# Patient Record
Sex: Male | Born: 1955 | Race: White | Hispanic: No | Marital: Married | Smoking: Never smoker
Health system: Southern US, Community
[De-identification: ages and names within clinical notes are randomized; demographics above are authoritative.]

## PROBLEM LIST (undated history)

## (undated) HISTORY — PX: NASAL SINUS SURGERY: SHX719

## (undated) HISTORY — PX: TENDON REPAIR: SHX5111

## (undated) HISTORY — PX: SHOULDER SURGERY: SHX246

---

## 2013-02-27 ENCOUNTER — Encounter (HOSPITAL_BASED_OUTPATIENT_CLINIC_OR_DEPARTMENT_OTHER): Payer: Self-pay | Admitting: Emergency Medicine

## 2013-02-27 ENCOUNTER — Emergency Department (HOSPITAL_BASED_OUTPATIENT_CLINIC_OR_DEPARTMENT_OTHER): Payer: BC Managed Care – PPO

## 2013-02-27 ENCOUNTER — Emergency Department (HOSPITAL_BASED_OUTPATIENT_CLINIC_OR_DEPARTMENT_OTHER)
Admission: EM | Admit: 2013-02-27 | Discharge: 2013-02-28 | Disposition: A | Discharge status: Home / Self Care | Payer: BC Managed Care – PPO | Attending: Emergency Medicine | Admitting: Emergency Medicine

## 2013-02-27 DIAGNOSIS — Y92838 Other recreation area as the place of occurrence of the external cause: Secondary | ICD-10-CM

## 2013-02-27 DIAGNOSIS — S62329A Displaced fracture of shaft of unspecified metacarpal bone, initial encounter for closed fracture: Secondary | ICD-10-CM | POA: Insufficient documentation

## 2013-02-27 DIAGNOSIS — S62619A Displaced fracture of proximal phalanx of unspecified finger, initial encounter for closed fracture: Secondary | ICD-10-CM

## 2013-02-27 DIAGNOSIS — Z23 Encounter for immunization: Secondary | ICD-10-CM | POA: Insufficient documentation

## 2013-02-27 DIAGNOSIS — IMO0002 Reserved for concepts with insufficient information to code with codable children: Secondary | ICD-10-CM | POA: Insufficient documentation

## 2013-02-27 DIAGNOSIS — S62302A Unspecified fracture of third metacarpal bone, right hand, initial encounter for closed fracture: Secondary | ICD-10-CM

## 2013-02-27 DIAGNOSIS — R296 Repeated falls: Secondary | ICD-10-CM | POA: Insufficient documentation

## 2013-02-27 DIAGNOSIS — Y9366 Activity, soccer: Secondary | ICD-10-CM | POA: Insufficient documentation

## 2013-02-27 DIAGNOSIS — Y9239 Other specified sports and athletic area as the place of occurrence of the external cause: Secondary | ICD-10-CM | POA: Insufficient documentation

## 2013-02-27 MED ORDER — TETANUS-DIPHTH-ACELL PERTUSSIS 5-2.5-18.5 LF-MCG/0.5 IM SUSP
0.5000 mL | Freq: Once | INTRAMUSCULAR | Status: AC
  Administered 2013-02-27: 0.5 mL via INTRAMUSCULAR
  Filled 2013-02-27: qty 0.5

## 2013-02-27 MED ORDER — HYDROCODONE-ACETAMINOPHEN 5-325 MG PO TABS: 1.0000 | ORAL_TABLET | Freq: Four times a day (QID) | ORAL | Status: AC | PRN

## 2013-02-27 NOTE — ED Provider Notes (Signed)
CSN: [id number]     Arrival date & time 02/27/13  2145 History  This chart was scribed for Hanley SeamenJohn L Myonna Chisom, MD by Ellin MayhewMichael Levi, ED Scribe. This patient was seen in room MH10/MH10 and the patient's care was started at 11:04 PM.   Chief Complaint  Patient presents with  . Hand Injury   The history is provided by the patient. No language interpreter was used.    HPI Comments: Waynette ButteryHans Furches is a 58 y.o. male who presents to the Emergency Department complaining of constant, unchanged pain to the R hand following a fall during a soccer game 1.5 hours ago. Patient reports feeling a popping with associated swelling to the base of the third proximal phalanx of the third finger of R hand, in addition to some generalized pain and swelling in the R hand. Patient rates his pain as a 4-5/10 and reports his pain is made worse with movement and pressure. Patient denies any other injury or pain. Patient is unsure of his last tetanus shot. [place]  History reviewed. No pertinent past medical history. Past Surgical History  Procedure Laterality Date  . Nasal sinus surgery    . Shoulder surgery    . Tendon repair     No family history on file. History  Substance Use Topics  . Smoking status: Never Smoker   . Smokeless tobacco: Not on file  . Alcohol Use: No    Review of Systems A complete 10 system review of systems was obtained and all systems are negative except as noted in the HPI and PMH.   Allergies  Review of patient's allergies indicates no known allergies.  Home Medications   Current Outpatient Rx  Name  Route  Sig  Dispense  Refill  . HYDROcodone-acetaminophen (NORCO/VICODIN) 5-325 MG per tablet   Oral   Take 1-2 tablets by mouth every 6 (six) hours as needed for moderate pain.   20 tablet   0     Triage Vitals: BP 129/80  Pulse 102  Temp(Src) 98.9 F (37.2 C) (Oral)  Resp 20  Ht 6\' 1"  (1.854 m)  Wt 215 lb (97.523 kg)  BMI 28.37 kg/m2  SpO2  98%  Physical Exam  Nursing note and vitals reviewed. General: Well-developed, well-nourished male in no acute distress; appearance consistent with age of record HENT: normocephalic; atraumatic Eyes: pupils equal, round and reactive to light; extraocular muscles intact Neck: supple. C-spine. Heart: regular rate and rhythm; no murmurs, rubs or gallops Lungs: clear to auscultation bilaterally Abdomen: soft; nondistended; nontender; no masses or hepatosplenomegaly; bowel sounds present Extremities: No tenderness of R wrist. Swelling and crepitus over R third metacarpal bone. No tenderness of R fourth finger or metacarpal. Tenderness of R third PIP joint. Location of pain correlates with fractre seen on x-ray. Neurologic: Awake, alert and oriented; motor function intact in all extremities and symmetric; no facial droop Skin: Warm and dry Psychiatric: Normal mood and affect  ED Course  Procedures (including critical care time)  DIAGNOSTIC STUDIES: Oxygen Saturation is 98% on room air, normal by my interpretation.    COORDINATION OF CARE: 11:07 PM-Reviewed positive fracture findings in x-ray results with patient. Advised patient he will require a splint and will need to see a hand surgeon when [place]. Recommended patient receive a tetanus shot today in the ED. Treatment plan discussed with patient and patient agrees.  MDM  Nursing notes and vitals signs, including pulse oximetry, reviewed.  Summary of this visit's results, reviewed by myself:  Imaging Studies: Dg Hand Complete Right  2013/03/11   CLINICAL DATA:  Status post fall with pain in the third and fourth fingers  EXAM: RIGHT HAND - COMPLETE 3+ VIEW  COMPARISON:  None.  FINDINGS: There is mild displaced intra-articular fracture at the base of the fourth proximal phalanx. There is mild displaced fracture of the mid third metacarpal shaft. There are small bony densities adjacent to the third proximal  interphalangeal joint with associated soft tissue swelling question small avulsion fractures.  IMPRESSION: Fractures of base of fourth proximal phalanx, mid third metacarpal.There are small bony densities adjacent to the third proximal interphalangeal joint with associated soft tissue swelling question small avulsion fractures.   Electronically Signed   By: Sherian Rein M.D.   On: 2013-03-11 22:47      Hanley Seamen, MD 03/11/2013 [phone]

## 2013-02-27 NOTE — ED Notes (Signed)
Playing soccer-fell on grass-landed on right hand-slight puncture wound noted with swelling

## 2013-02-27 NOTE — ED Notes (Signed)
Wound cleansed and soaked with betadine, small abrasion to inner middle finger noted, wound irrigated

## 2014-09-07 IMAGING — CR DG HAND COMPLETE 3+V*R*
3 series · 3 of 3 positions shown · non-contrast
Comparison: None.

CLINICAL DATA: Status post fall with pain in the third and fourth
fingers

EXAM:
RIGHT HAND - COMPLETE 3+ VIEW

[x hand pa right]
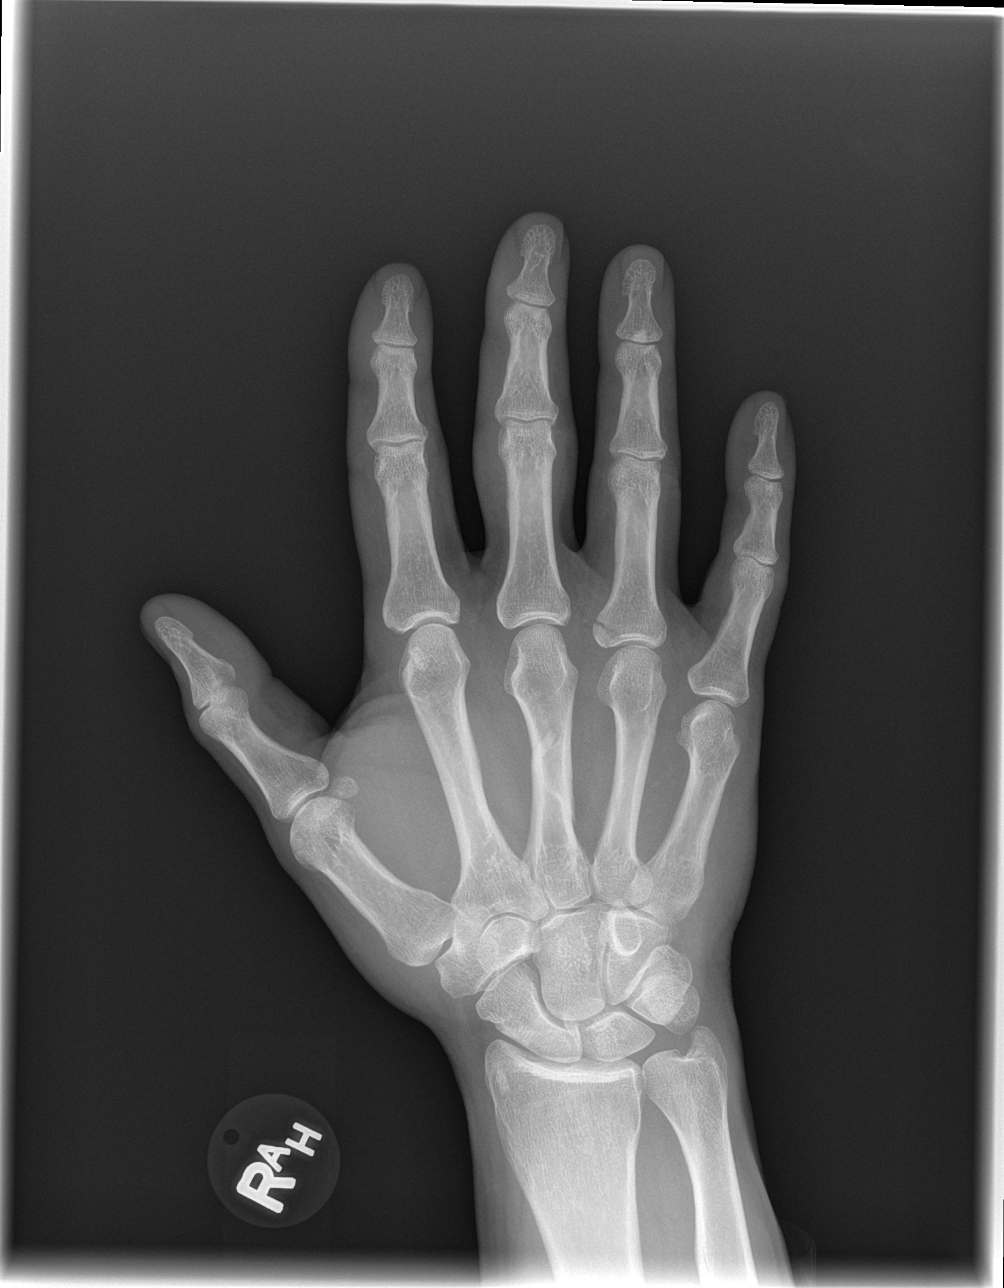

[x hand oblique right]
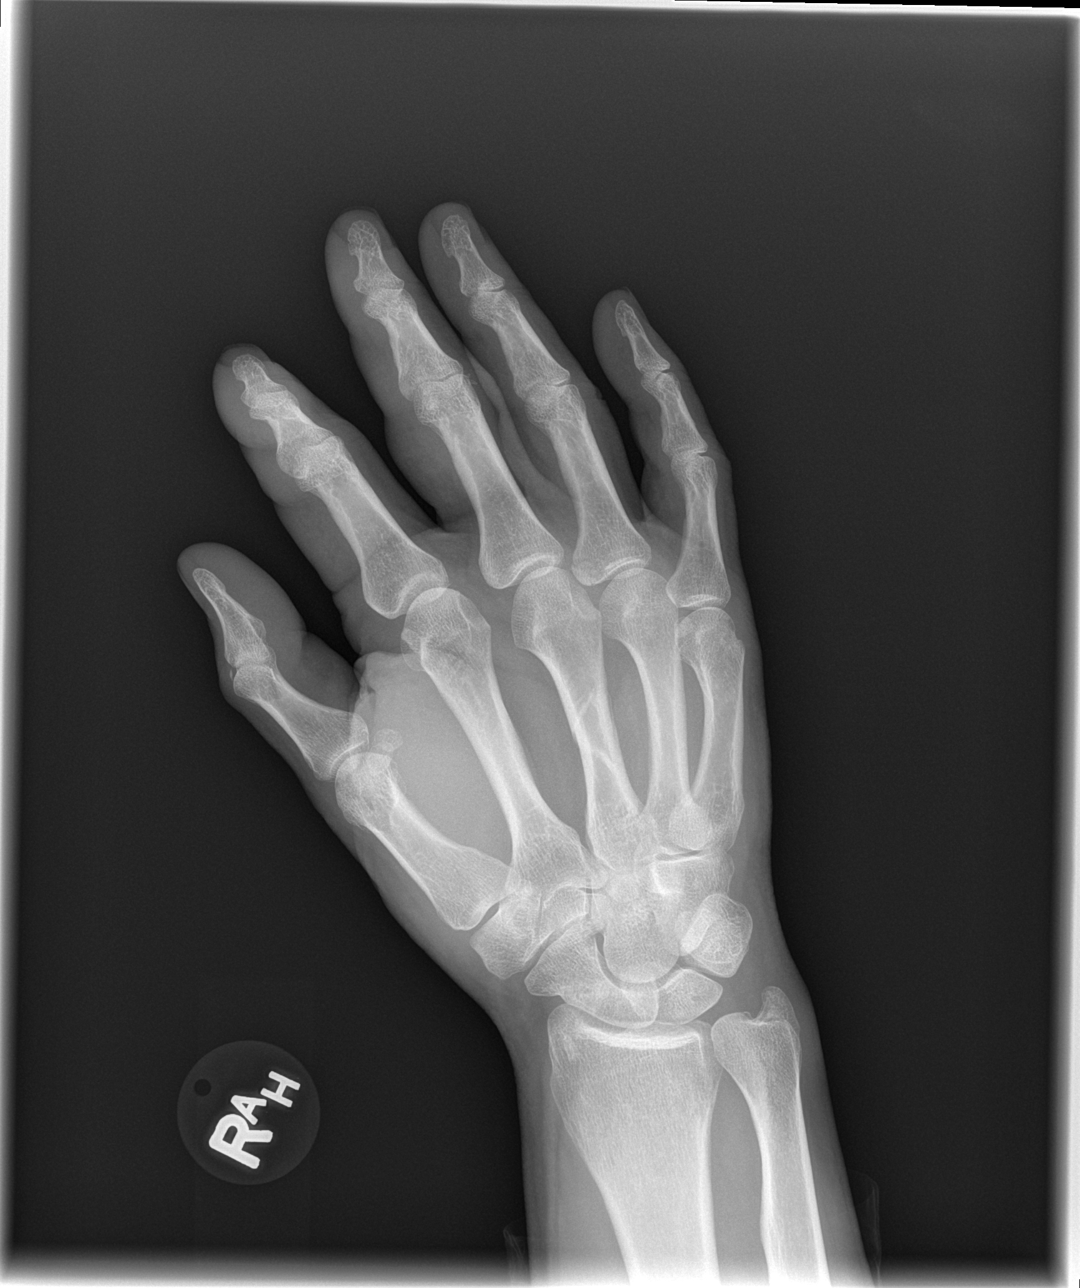

[x hand lat right]
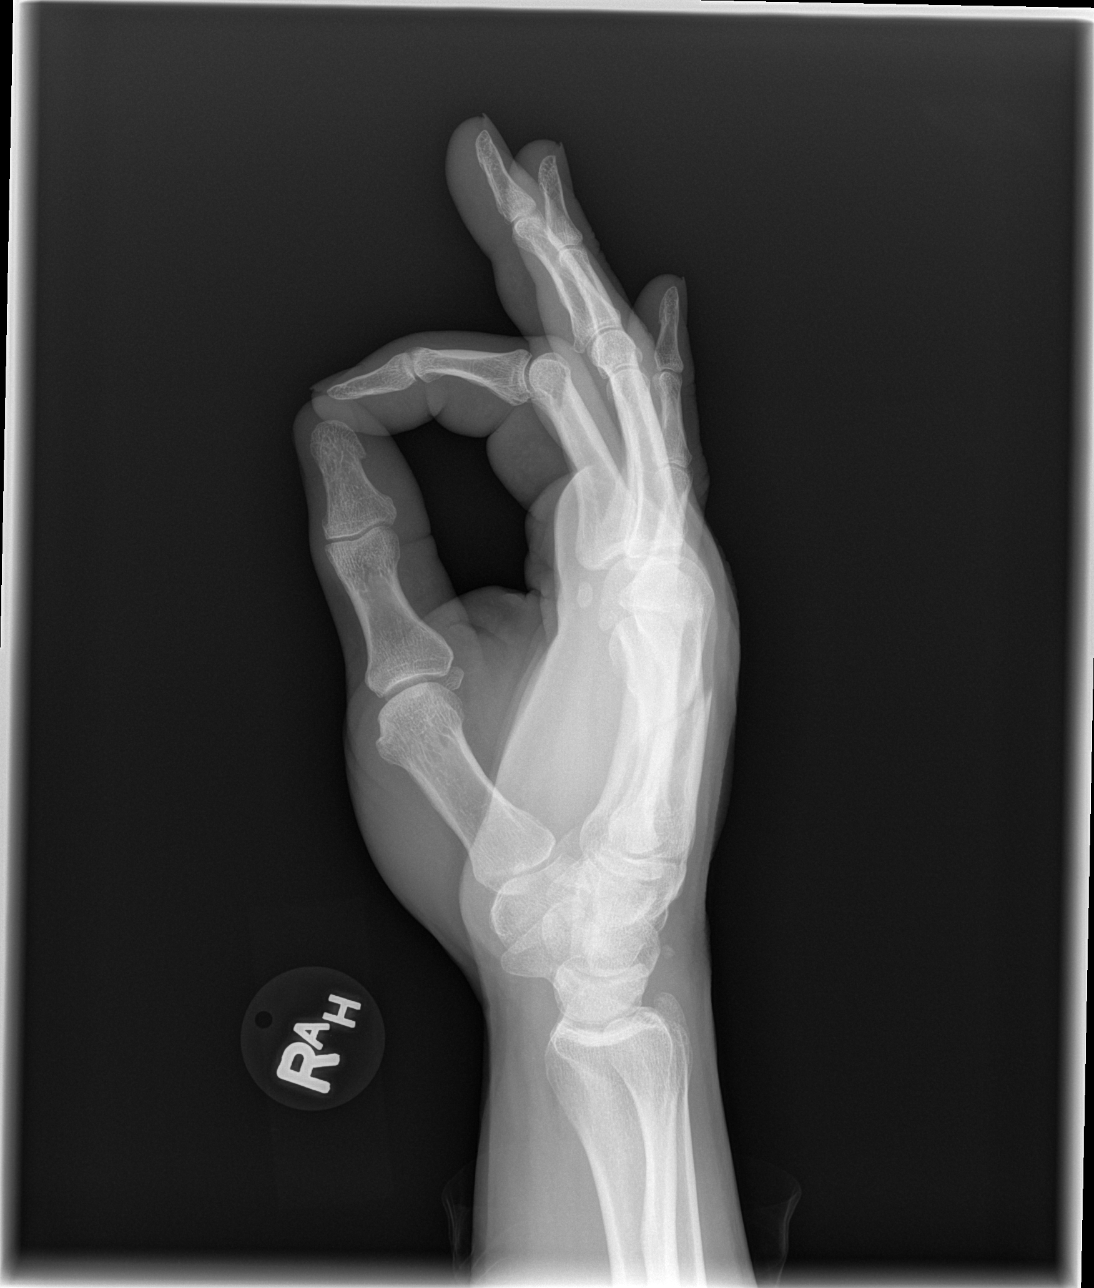

[3 of 3 positions shown; findings below may reference images not displayed]

FINDINGS: There is mild displaced intra-articular fracture at the base of the
fourth proximal phalanx. There is mild displaced fracture of the mid
third metacarpal shaft. There are small bony densities adjacent to
the third proximal interphalangeal joint with associated soft tissue
swelling question small avulsion fractures.
IMPRESSION: Fractures of base of fourth proximal phalanx, mid third
metacarpal.There are small bony densities adjacent to the third
proximal interphalangeal joint with associated soft tissue swelling
question small avulsion fractures.
